# Patient Record
Sex: Male | Born: 1970 | Race: Black or African American | Hispanic: No | Marital: Single | Smoking: Never smoker
Health system: Southern US, Community
[De-identification: ages and names within clinical notes are randomized; demographics above are authoritative.]

---

## 2006-08-27 ENCOUNTER — Emergency Department: Payer: Self-pay | Admitting: Emergency Medicine

## 2009-01-07 ENCOUNTER — Emergency Department: Payer: Self-pay | Admitting: Emergency Medicine

## 2009-11-24 ENCOUNTER — Emergency Department: Payer: Self-pay | Admitting: Internal Medicine

## 2010-05-29 ENCOUNTER — Emergency Department: Payer: Self-pay | Admitting: Emergency Medicine

## 2010-05-30 ENCOUNTER — Emergency Department: Payer: Self-pay | Admitting: Emergency Medicine

## 2011-02-23 ENCOUNTER — Emergency Department: Payer: Self-pay | Admitting: *Deleted

## 2012-05-09 ENCOUNTER — Emergency Department: Payer: Self-pay | Admitting: Emergency Medicine

## 2012-11-04 ENCOUNTER — Emergency Department: Payer: Self-pay | Admitting: Emergency Medicine

## 2012-11-06 LAB — BETA STREP CULTURE(ARMC)

## 2013-06-04 ENCOUNTER — Emergency Department: Payer: Self-pay | Admitting: Emergency Medicine

## 2014-09-10 ENCOUNTER — Emergency Department: Payer: Self-pay | Admitting: Emergency Medicine

## 2015-11-27 IMAGING — CR DG FOOT 2V*L*
1 series · 2 of 2 positions shown · non-contrast
Comparison: None.

CLINICAL DATA: Stepped on a nail.

EXAM:
LEFT FOOT - 2 VIEW

[Series 1: ap · 0.17mm/px · 2 of 2 slices shown]
[im 1/2]
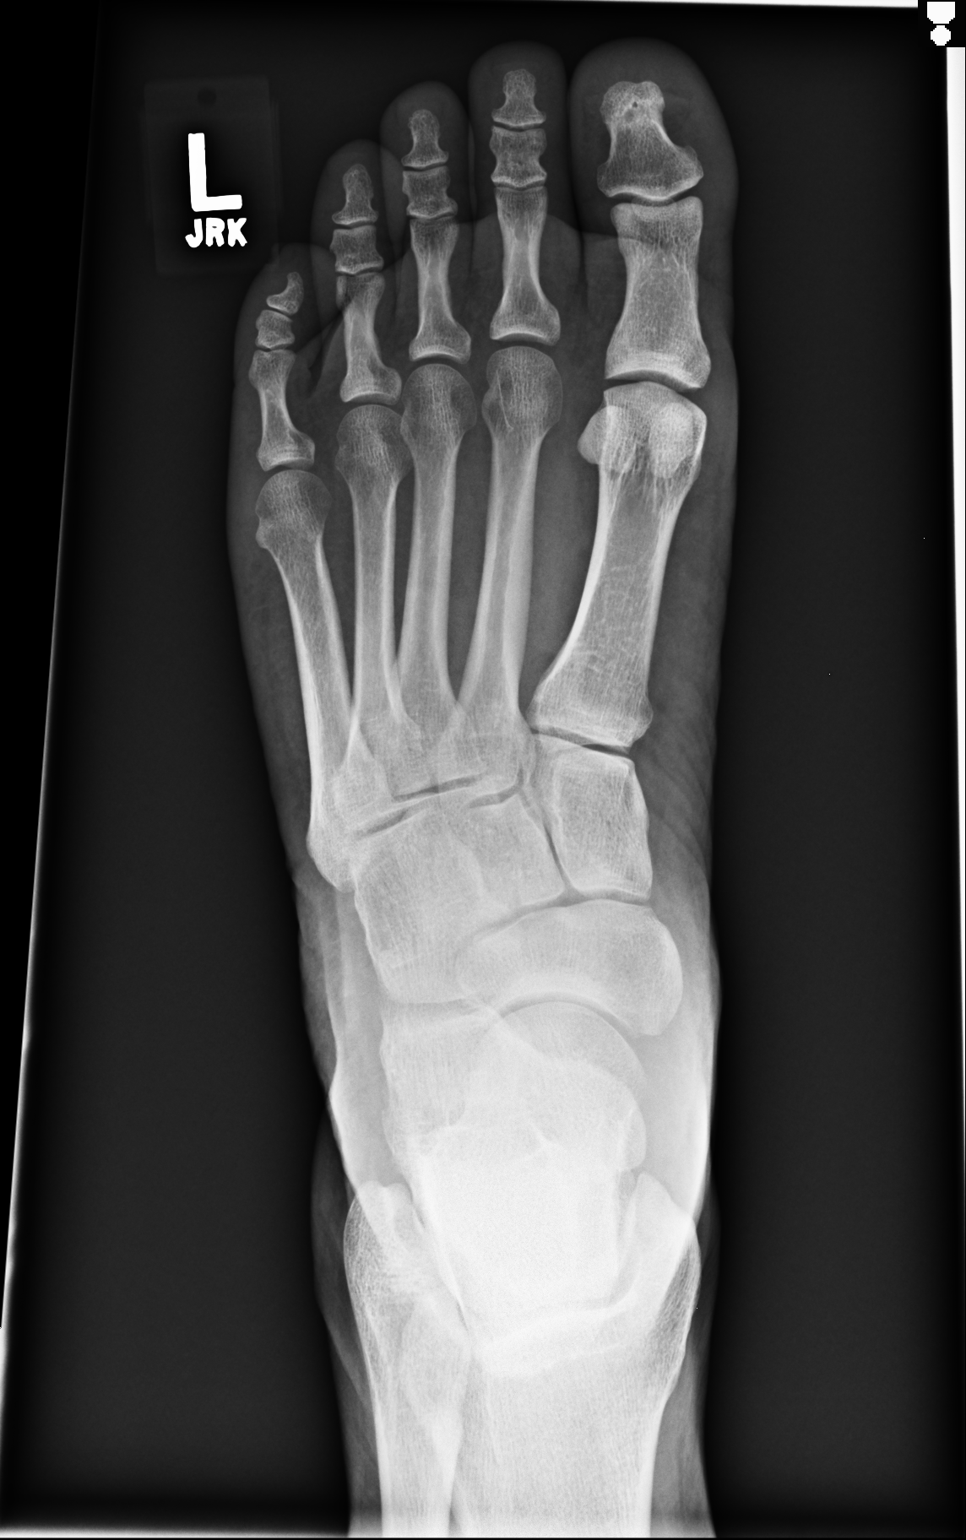
[im 2/2]
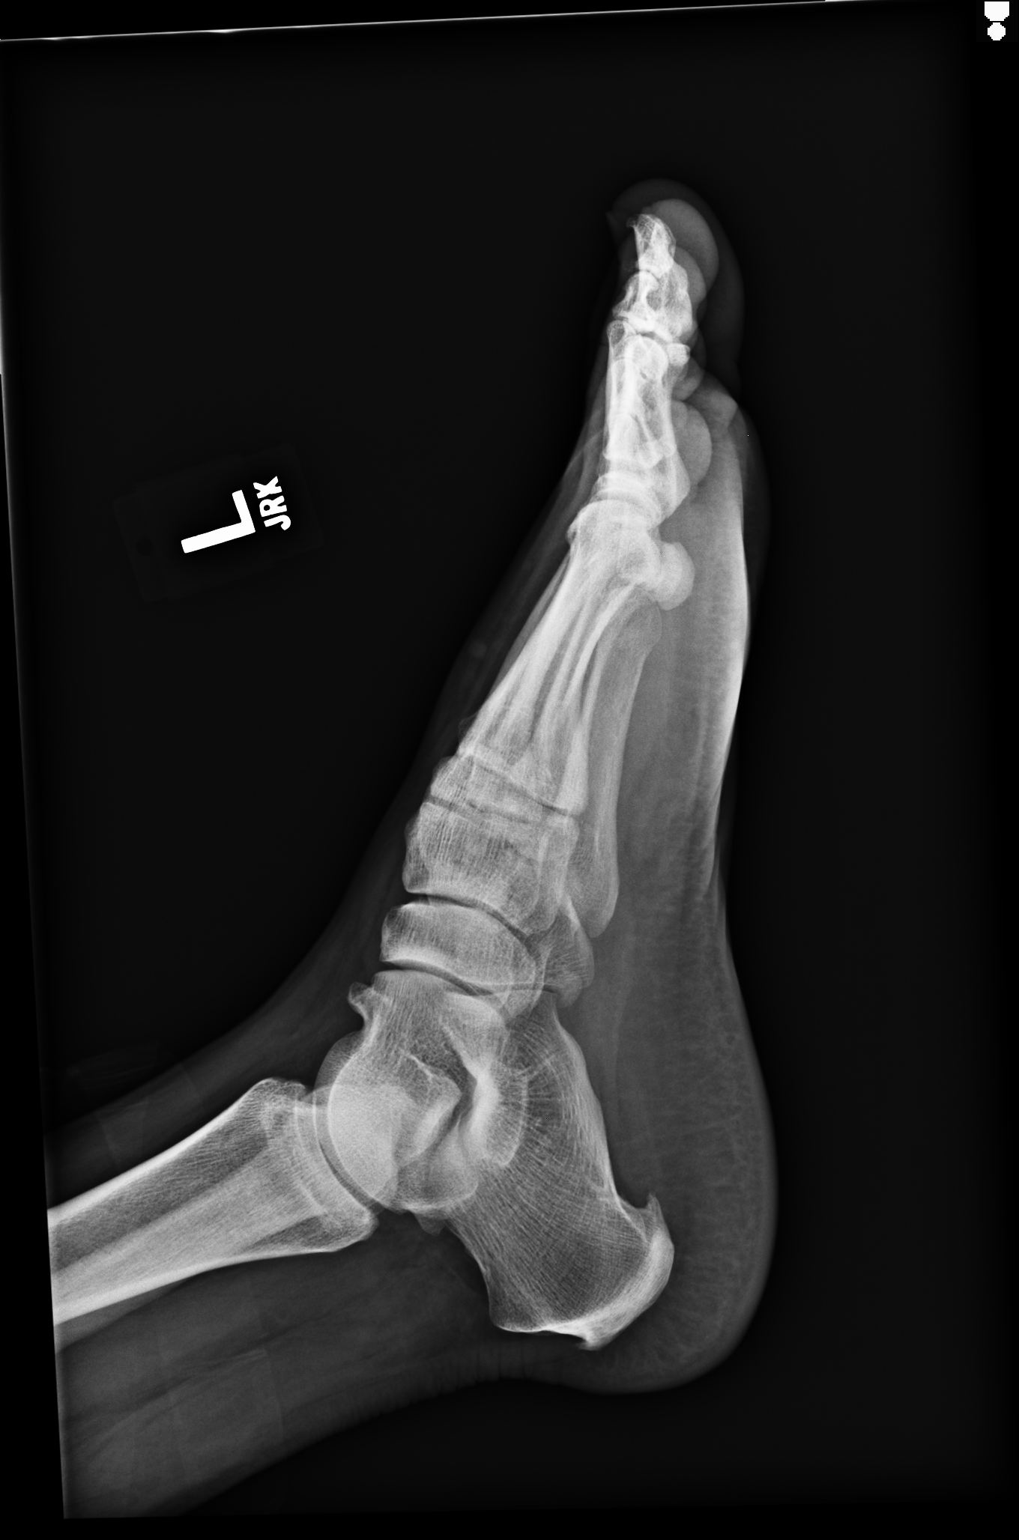

[2 of 2 positions shown; findings below may reference images not displayed]

FINDINGS: There is no evidence of fracture or dislocation. There is no
evidence of arthropathy or other focal bone abnormality. Soft
tissues are unremarkable. No foreign body is seen.
IMPRESSION: Negative.

## 2017-08-11 ENCOUNTER — Encounter: Payer: Self-pay | Admitting: Podiatry

## 2017-08-21 ENCOUNTER — Ambulatory Visit: Payer: BLUE CROSS/BLUE SHIELD | Admitting: Podiatry

## 2017-08-21 ENCOUNTER — Ambulatory Visit (INDEPENDENT_AMBULATORY_CARE_PROVIDER_SITE_OTHER): Payer: BLUE CROSS/BLUE SHIELD

## 2017-08-21 DIAGNOSIS — M722 Plantar fascial fibromatosis: Secondary | ICD-10-CM

## 2017-08-21 MED ORDER — MELOXICAM 15 MG PO TABS: 15.0000 mg | ORAL_TABLET | Freq: Every day | ORAL | 1 refills | Status: AC

## 2017-08-21 MED ORDER — METHYLPREDNISOLONE 4 MG PO TBPK: ORAL_TABLET | ORAL | 0 refills | Status: DC

## 2017-08-21 NOTE — Progress Notes (Signed)
   Subjective:    Patient ID: Gabriel Duncan, male    DOB: 1971/03/14, 47 y.o.   MRN: [id number]  HPI    Review of Systems  All other systems reviewed and are negative.      Objective:   Physical Exam        Assessment & Plan:

## 2017-08-24 NOTE — Progress Notes (Signed)
   Subjective: Patient presents today for pain and tenderness in the plantar aspect of the left heel that began 3 months ago. He states that it hurts in the mornings with the first steps out of bed. Walking and standing for long periods of time increase the pain as well. He has not done anything to treat the symptoms. Patient presents today for further treatment and evaluation.  No past medical history on file.   Objective: Physical Exam General: The patient is alert and oriented x3 in no acute distress.  Dermatology: Skin is warm, dry and supple bilateral lower extremities. Negative for open lesions or macerations bilateral.   Vascular: Dorsalis Pedis and Posterior Tibial pulses palpable bilateral.  Capillary fill time is immediate to all digits.  Neurological: Epicritic and protective threshold intact bilateral.   Musculoskeletal: Tenderness to palpation to the plantar aspect of the left heel along the plantar fascia. All other joints range of motion within normal limits bilateral. Strength 5/5 in all groups bilateral.   Radiographic exam:   Normal osseous mineralization. Joint spaces preserved. No fracture/dislocation/boney destruction. No other soft tissue abnormalities or radiopaque foreign bodies.   Assessment: 1. Plantar fasciitis left foot  Plan of Care:  1. Patient evaluated. Xrays reviewed.   2. Injection of 0.5cc Celestone soluspan injected into the left plantar fascia.  3. Rx for Medrol Dose Pak placed 4. Rx for Mobic 15mg  PO ordered for patient. 4. Plantar fascial band(s) dispensed  5. Instructed patient regarding therapies and modalities at home to alleviate symptoms.  6. Return to clinic in 4 weeks.    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    Felecia ShellingBrent M. Azura Tufaro, DPM [facility]  Dr. Felecia ShellingBrent M. Freeda Spivey, DPM    [address]                                     [place], [place] [address]                Office [phone]  Fax [phone]  [phone]

## 2017-08-31 NOTE — Progress Notes (Signed)
This encounter was created in error - please disregard.

## 2017-09-18 ENCOUNTER — Encounter: Payer: BLUE CROSS/BLUE SHIELD | Admitting: Podiatry

## 2017-09-29 NOTE — Progress Notes (Signed)
This encounter was created in error - please disregard.

## 2019-03-29 ENCOUNTER — Other Ambulatory Visit: Payer: Self-pay

## 2019-03-29 ENCOUNTER — Encounter: Payer: Self-pay | Admitting: Family Medicine

## 2019-03-29 ENCOUNTER — Ambulatory Visit: Payer: Self-pay | Admitting: Family Medicine

## 2019-03-29 DIAGNOSIS — Z113 Encounter for screening for infections with a predominantly sexual mode of transmission: Secondary | ICD-10-CM

## 2019-03-29 DIAGNOSIS — A6002 Herpesviral infection of other male genital organs: Secondary | ICD-10-CM

## 2019-03-29 MED ORDER — VALACYCLOVIR HCL 1 G PO TABS: 1000.0000 mg | ORAL_TABLET | Freq: Every day | ORAL | 3 refills | Status: AC

## 2019-03-29 NOTE — Progress Notes (Signed)
    STI clinic/screening visit  Subjective:  Gabriel Duncan is a 48 y.o. male being seen today for an STI screening visit. The patient reports they do not have symptoms.  Patient has the following medical conditions:  There are no active problems to display for this patient.  No chief complaint on file.   HPI  Patient reports he needs a prescription for Valtrex.  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  He hasn't had an outbreak in 2-3 yrs.  See flowsheet for further details and programmatic requirements.    The following portions of the patient's history were reviewed and updated as appropriate: allergies, current medications, past medical history, past social history, past surgical history and problem list.  Objective:  There were no vitals filed for this visit.  Physical Exam client declined exam and testing.  Assessment and Plan:  Gabriel Duncan is a 48 y.o. male presenting to the [place] for STI screening  1. Screening examination for venereal disease  2. Herpes genitalis in men  - valACYclovir (VALTREX) 1000 MG tablet; Take 1 tablet (1,000 mg total) by mouth daily.  Dispense: 90 tablet; Refill: 3   No follow-ups on file.  No future appointments.  Hassell Done, FNP

## 2019-08-15 ENCOUNTER — Other Ambulatory Visit: Payer: Self-pay

## 2019-08-15 ENCOUNTER — Telehealth: Payer: Self-pay

## 2019-08-15 DIAGNOSIS — Z1211 Encounter for screening for malignant neoplasm of colon: Secondary | ICD-10-CM

## 2019-08-15 NOTE — Telephone Encounter (Signed)
Gastroenterology Pre-Procedure Review  Request Date: Monday 09/26/19 Requesting Physician: Dr. Tobi Bastos  PATIENT REVIEW QUESTIONS: The patient responded to the following health history questions as indicated:    1. Are you having any GI issues? no 2. Do you have a personal history of Polyps? no 3. Do you have a family history of Colon Cancer or Polyps? no 4. Diabetes Mellitus? no 5. Joint replacements in the past 12 months?no 6. Major health problems in the past 3 months?yes (COVID 1 month ago required 4 days of hospitalization at [facility]) 7. Any artificial heart valves, MVP, or defibrillator?no    MEDICATIONS & ALLERGIES:    Patient reports the following regarding taking any anticoagulation/antiplatelet therapy:   Plavix, Coumadin, Eliquis, Xarelto, Lovenox, Pradaxa, Brilinta, or Effient? no Aspirin? no  Patient confirms/reports the following medications:  Current Outpatient Medications  Medication Sig Dispense Refill  . methylPREDNISolone (MEDROL DOSEPAK) 4 MG TBPK tablet 6 day dose pack - take as directed (Patient not taking: Reported on 03/29/2019) 21 tablet 0   No current facility-administered medications for this visit.    Patient confirms/reports the following allergies:  No Known Allergies  No orders of the defined types were placed in this encounter.   AUTHORIZATION INFORMATION Primary Insurance: 1D#: Group #:  Secondary Insurance: 1D#: Group #:  SCHEDULE INFORMATION: Date: Monday 09/26/19 Time: Location:[facility]

## 2019-09-12 ENCOUNTER — Ambulatory Visit (INDEPENDENT_AMBULATORY_CARE_PROVIDER_SITE_OTHER): Payer: BC Managed Care – PPO | Admitting: Urology

## 2019-09-12 ENCOUNTER — Other Ambulatory Visit: Payer: Self-pay

## 2019-09-12 ENCOUNTER — Encounter: Payer: Self-pay | Admitting: Urology

## 2019-09-12 VITALS — BP 126/79 | HR 94 | Ht 68.0 in | Wt 235.0 lb

## 2019-09-12 DIAGNOSIS — N529 Male erectile dysfunction, unspecified: Secondary | ICD-10-CM

## 2019-09-12 MED ORDER — SILDENAFIL CITRATE 20 MG PO TABS: ORAL_TABLET | ORAL | 0 refills | Status: AC

## 2019-09-12 NOTE — Progress Notes (Signed)
   09/12/2019 2:42 PM   [name] 26-Nov-1970 [id number]  Referring provider: Emogene Morgan, MD [address] [place],  [place] [address]  Chief Complaint  Patient presents with  . Erectile Dysfunction    HPI: Gabriel Duncan is a 49 YO male seen in consultation at the request of Dr. Earlene Plater for evaluation of erectile dysfunction.   -Duration 2-3 months -Partial erections firm enough for penetration approximately 50% -Times penetration achieved difficulty maintaining -SHIM 15 indicating mild-moderate ED -No diabetes, hypertension, antihypertensive medications, tobacco history or neurologic disease -No pain or curvature with erections  PMH: No past medical history on file.  Surgical History: History reviewed. No pertinent surgical history.  Home Medications:  Allergies as of 09/12/2019   No Known Allergies     Medication List       Accurate as of September 12, 2019  2:42 PM. If you have any questions, ask your nurse or doctor.        acetaminophen 500 MG tablet Commonly known as: TYLENOL Take by mouth.   aspirin-acetaminophen-caffeine 250-250-65 MG tablet Commonly known as: EXCEDRIN MIGRAINE Take by mouth.   guaifenesin 100 MG/5ML syrup Commonly known as: ROBITUSSIN Take by mouth.   Ibuprofen 200 MG Caps Take by mouth.   methylPREDNISolone 4 MG Tbpk tablet Commonly known as: MEDROL DOSEPAK 6 day dose pack - take as directed   valACYclovir 1000 MG tablet Commonly known as: VALTREX Take 1,000 mg by mouth daily.       Allergies: No Known Allergies  Family History: No family history on file.  Social History:  reports that he has never smoked. He has never used smokeless tobacco. No history on file for alcohol and drug.   Physical Exam: BP 126/79   Pulse 94   Ht 5\' 8"  (1.727 m)   Wt 235 lb (106.6 kg)   BMI 35.73 kg/m   Constitutional:  Alert and oriented, No acute distress. HEENT: [place] AT, moist mucus membranes.  Trachea midline,  no masses. Cardiovascular: No clubbing, cyanosis, or edema. Respiratory: Normal respiratory effort, no increased work of breathing. GI: Abdomen is soft, nontender, nondistended, no abdominal masses GU: Phallus without lesions, testes descended bilaterally without masses or tenderness, testicular volume >20 cc bilaterally. Skin: No rashes, bruises or suspicious lesions. Neurologic: Grossly intact, no focal deficits, moving all 4 extremities. Psychiatric: Normal mood and affect.   Assessment & Plan:    - Erectile dysfunction Mild to moderate ED without organic risk factors.  Potential etiologies were discussed including psychogenic ED and the possibility of hypogonadism though no significant symptoms.  I did recommend checking an a.m. testosterone level.  If normal most likely psychogenic.  He was interested in a brief trial of a PDE 5 inhibitor and Rx generic sildenafil was sent to pharmacy.  He will be notified with his testosterone level.   , MD  [facility] [address], [address] [place], [place] [phone]

## 2019-09-16 ENCOUNTER — Other Ambulatory Visit: Payer: Self-pay

## 2019-09-16 ENCOUNTER — Other Ambulatory Visit: Payer: BC Managed Care – PPO

## 2019-09-16 DIAGNOSIS — N529 Male erectile dysfunction, unspecified: Secondary | ICD-10-CM

## 2019-09-17 LAB — LUTEINIZING HORMONE: LH: 6.1 m[IU]/mL (ref 1.7–8.6)

## 2019-09-17 LAB — TESTOSTERONE: Testosterone: 446 ng/dL (ref 264–916)

## 2019-09-19 ENCOUNTER — Telehealth: Payer: Self-pay | Admitting: *Deleted

## 2019-09-19 NOTE — Telephone Encounter (Signed)
Left message on cell phone

## 2019-09-19 NOTE — Telephone Encounter (Signed)
-----   Message from Riki Altes, MD sent at 09/17/2019 12:27 PM EDT ----- Testosterone level is normal

## 2019-09-22 ENCOUNTER — Other Ambulatory Visit: Admission: RE | Admit: 2019-09-22 | Payer: BC Managed Care – PPO | Source: Ambulatory Visit

## 2019-09-26 ENCOUNTER — Encounter: Payer: Self-pay | Admitting: Anesthesiology

## 2019-09-26 ENCOUNTER — Encounter: Admission: RE | Payer: Self-pay | Source: Home / Self Care

## 2019-09-26 ENCOUNTER — Ambulatory Visit
Admission: RE | Admit: 2019-09-26 | Payer: BC Managed Care – PPO | Source: Home / Self Care | Admitting: Gastroenterology

## 2019-09-26 SURGERY — COLONOSCOPY WITH PROPOFOL
Anesthesia: General

## 2020-05-08 ENCOUNTER — Other Ambulatory Visit: Payer: Self-pay

## 2020-05-08 ENCOUNTER — Ambulatory Visit: Payer: Self-pay | Admitting: Physician Assistant

## 2020-05-08 DIAGNOSIS — B009 Herpesviral infection, unspecified: Secondary | ICD-10-CM

## 2020-05-08 DIAGNOSIS — Z113 Encounter for screening for infections with a predominantly sexual mode of transmission: Secondary | ICD-10-CM

## 2020-05-08 MED ORDER — VALACYCLOVIR HCL 1 G PO TABS: 1000.0000 mg | ORAL_TABLET | Freq: Every day | ORAL | 12 refills | Status: DC

## 2020-05-09 ENCOUNTER — Encounter: Payer: Self-pay | Admitting: Physician Assistant

## 2020-05-09 NOTE — Progress Notes (Signed)
S:  Patient into clinic for Rx for suppressive treatment for HSV.  Denies changes in history since last visit.  Declines screening exam and blood work.  NKDA. O:  WDWN male in NAD, A&O x 3, normal work of breathing. A/P:  1.  Patient with history of HSV and requesting renewal of Rx for suppressive therapy. 2.  Will send Rx for Valtrex 1 g #30 1 po daily with refills for 1 year to patient's pharmacy of choice. 3.  Enc condoms with all sex. 4.  RTC prn and in 1 year.

## 2021-06-02 ENCOUNTER — Other Ambulatory Visit: Payer: Self-pay | Admitting: Physician Assistant

## 2021-06-02 DIAGNOSIS — B009 Herpesviral infection, unspecified: Secondary | ICD-10-CM

## 2021-06-06 ENCOUNTER — Ambulatory Visit: Payer: Self-pay

## 2021-06-06 ENCOUNTER — Other Ambulatory Visit: Payer: Self-pay

## 2021-06-06 ENCOUNTER — Ambulatory Visit: Payer: Self-pay | Admitting: Family Medicine

## 2021-06-06 DIAGNOSIS — B009 Herpesviral infection, unspecified: Secondary | ICD-10-CM

## 2021-06-06 MED ORDER — VALACYCLOVIR HCL 1 G PO TABS: 1000.0000 mg | ORAL_TABLET | Freq: Every day | ORAL | 12 refills | Status: DC

## 2021-06-06 NOTE — Progress Notes (Signed)
S: Patient in clinic for HSV medication refill.  Patient reports running out  of medication.  Patient takes supressively and last outbreak was in 2021.     O: + HSV 2    A/P: 1. HSV-2 infection - valACYclovir (VALTREX) 1000 MG tablet; Take 1 tablet (1,000 mg total) by mouth daily.  Dispense: 30 tablet; Refill: 12    2. Sexually transmitted disease counseling Declined need for other STI testing today.   RX sent to desired pharmacy      Wendi Snipes, FNP

## 2021-11-15 ENCOUNTER — Encounter: Payer: Self-pay | Admitting: Family Medicine

## 2021-11-22 ENCOUNTER — Inpatient Hospital Stay: Payer: BC Managed Care – PPO | Admitting: Internal Medicine

## 2021-11-22 ENCOUNTER — Inpatient Hospital Stay: Payer: BC Managed Care – PPO

## 2021-12-06 ENCOUNTER — Other Ambulatory Visit: Payer: Self-pay

## 2021-12-06 ENCOUNTER — Inpatient Hospital Stay: Payer: BC Managed Care – PPO

## 2021-12-06 ENCOUNTER — Inpatient Hospital Stay: Payer: BC Managed Care – PPO | Attending: Internal Medicine | Admitting: Internal Medicine

## 2021-12-06 ENCOUNTER — Encounter: Payer: Self-pay | Admitting: Internal Medicine

## 2021-12-06 ENCOUNTER — Telehealth: Payer: Self-pay | Admitting: Internal Medicine

## 2021-12-06 DIAGNOSIS — D649 Anemia, unspecified: Secondary | ICD-10-CM

## 2021-12-06 DIAGNOSIS — R0789 Other chest pain: Secondary | ICD-10-CM | POA: Diagnosis not present

## 2021-12-06 DIAGNOSIS — D696 Thrombocytopenia, unspecified: Secondary | ICD-10-CM | POA: Insufficient documentation

## 2021-12-06 DIAGNOSIS — N529 Male erectile dysfunction, unspecified: Secondary | ICD-10-CM | POA: Diagnosis not present

## 2021-12-06 DIAGNOSIS — R5383 Other fatigue: Secondary | ICD-10-CM | POA: Diagnosis not present

## 2021-12-06 NOTE — Progress Notes (Signed)
New patient evaluation.   

## 2021-12-06 NOTE — Progress Notes (Signed)
Lab orders

## 2021-12-06 NOTE — Assessment & Plan Note (Addendum)
#  Mild anemia hemoglobin 11-12; normal MCV platelets 130s [April May 2023]-unclear etiology.  Mild symptoms from fatigue/anemia.   #Had a long discussion with the patient regarding etiology of unexplained anemia/thrombocytopenia.  The etiologies include -benign like liver disease, viral infections, autoimmune; blood loss etc.  Low clinical concern for any malignant causes.  I recommend CBC CMP LDH haptoglobin B12 folic acid reticulocyte count platelet immature fraction; review of peripheral smear.  Iron studies; ferritin. No obvious liver disease noted.   # Screening: Discussed that he will need screening colonoscopy irrespective of above work-up.  Thank you Dr.Aycock for allowing me to participate in the care of your pleasant patient. Please do not hesitate to contact me with questions or concerns in the interim.  # DISPOSITION: # labs today- ordered # follow up in 2-3 weeks- MD: no labs-Dr.B

## 2021-12-06 NOTE — Telephone Encounter (Signed)
Called patient to schedule labs. He forgot to stop by after his appointment.

## 2021-12-06 NOTE — Progress Notes (Signed)
[facility] NOTE  Patient Care Team: Donnie Coffin, MD as PCP - General (Family Medicine) Cammie Sickle, MD as Consulting Physician (Oncology)  CHIEF COMPLAINTS/PURPOSE OF CONSULTATION: ANEMIA   HEMATOLOGY HISTORY  #Mild ANEMIA/thrombocytopenia [APRIL-MAY, 2023-Hb;11.5 MCV-85 platelets-128 WBC -5 [PCP]  Iron sat;ferritin/  GFR- CT/US; EGD/colonoscopy-NONE  HISTORY OF PRESENTING ILLNESS:  Gabriel Duncan 51 y.o.  male pleasant patient was been referred to [place] for further evaluation of anemia.  Patient complains of mild fatigue.  Otherwise denies any weight loss.  Denies any unusual joint pains or worsening bone pain.  Never had any prior endoscopies.  Blood in stools: none Blood in urine:none  Difficulty swallowing:none Change of bowel movement/constipation: none Prior blood transfusion: none Prior history of blood loss: none Liver disease: none Alcohol: beer every other week.  Bariatric surgery: none  Prior evaluation with hematology: none Prior bone marrow biopsy: none Oral iron:  none Prior IV iron infusions: none   Review of Systems  Constitutional:  Positive for malaise/fatigue. Negative for chills, diaphoresis, fever and weight loss.  HENT:  Negative for nosebleeds and sore throat.   Eyes:  Negative for double vision.  Respiratory:  Negative for cough, hemoptysis, sputum production, shortness of breath and wheezing.   Cardiovascular:  Negative for chest pain, palpitations, orthopnea and leg swelling.  Gastrointestinal:  Negative for abdominal pain, blood in stool, constipation, diarrhea, heartburn, melena, nausea and vomiting.  Genitourinary:  Negative for dysuria, frequency and urgency.  Musculoskeletal:  Negative for back pain and joint pain.  Skin: Negative.  Negative for itching and rash.  Neurological:  Negative for dizziness, tingling, focal weakness, weakness and headaches.  Endo/Heme/Allergies:  Does not bruise/bleed  easily.  Psychiatric/Behavioral:  Negative for depression. The patient is not nervous/anxious and does not have insomnia.      MEDICAL HISTORY:  History reviewed. No pertinent past medical history.  SURGICAL HISTORY: History reviewed. No pertinent surgical history.  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 Kids- not home.   Social Determinants of Health   Financial Resource Strain: Not on file  Food Insecurity: Not on file  Transportation Needs: No Transportation Needs (12/06/2021)   PRAPARE - Hydrologist (Medical): No    Lack of Transportation (Non-Medical): No  Physical Activity: Not on file  Stress: Not on file  Social Connections: Not on file  Intimate Partner Violence: Not on file    FAMILY HISTORY: Family History  Problem Relation Age of Onset   Diabetes Mother    Cancer Neg Hx     ALLERGIES:  has No Known Allergies.  MEDICATIONS:  Current Outpatient Medications  Medication Sig Dispense Refill   acetaminophen (TYLENOL) 500 MG tablet Take by mouth.     aspirin-acetaminophen-caffeine (EXCEDRIN MIGRAINE) 250-250-65 MG tablet Take by mouth.     Ibuprofen 200 MG CAPS Take by mouth.  sildenafil (REVATIO) 20 MG tablet 2-3 tabs 1 hour prior to intercourse 10 tablet 0   valACYclovir (VALTREX) 1000 MG tablet Take 1,000 mg by mouth daily.     azithromycin (ZITHROMAX) 250 MG tablet Take 250 mg by mouth as directed. (Patient not  taking: Reported on 12/06/2021)     guaifenesin (ROBITUSSIN) 100 MG/5ML syrup Take by mouth. (Patient not taking: Reported on 12/06/2021)     methylPREDNISolone (MEDROL DOSEPAK) 4 MG TBPK tablet 6 day dose pack - take as directed (Patient not taking: Reported on 03/29/2019) 21 tablet 0   valACYclovir (VALTREX) 1000 MG tablet Take 1 tablet (1,000 mg total) by mouth daily. (Patient not taking: Reported on 12/06/2021) 30 tablet 12   valACYclovir (VALTREX) 1000 MG tablet Take 1 tablet (1,000 mg total) by mouth daily. (Patient not taking: Reported on 12/06/2021) 30 tablet 12   No current facility-administered medications for this visit.     Marland Kitchen  PHYSICAL EXAMINATION:   Vitals:   12/06/21 1100  BP: 113/71  Pulse: 90  Resp: 18  Temp: 98.7 F (37.1 C)   Filed Weights   12/06/21 1100  Weight: 243 lb 9.6 oz (110.5 kg)    Physical Exam Vitals and nursing note reviewed.  Constitutional:      Comments:     HENT:     Head: Normocephalic and atraumatic.     Mouth/Throat:     Mouth: Mucous membranes are moist.     Pharynx: Oropharynx is clear. No oropharyngeal exudate.  Eyes:     Extraocular Movements: Extraocular movements intact.     Pupils: Pupils are equal, round, and reactive to light.  Cardiovascular:     Rate and Rhythm: Normal rate and regular rhythm.  Pulmonary:     Effort: No respiratory distress.     Breath sounds: No wheezing.     Comments: Decreased breath sounds bilaterally.  Abdominal:     General: Bowel sounds are normal. There is no distension.     Palpations: Abdomen is soft. There is no mass.     Tenderness: There is no abdominal tenderness. There is no guarding or rebound.  Musculoskeletal:        General: No tenderness. Normal range of motion.     Cervical back: Normal range of motion and neck supple.  Skin:    General: Skin is warm.  Neurological:     General: No focal deficit present.     Mental Status: He is alert and oriented to person, place, and time.   Psychiatric:        Mood and Affect: Affect normal.        Behavior: Behavior normal.        Judgment: Judgment normal.      LABORATORY DATA:  I have reviewed the data as listed No results found for: "WBC", "HGB", "HCT", "MCV", "PLT" No results for input(s): "NA", "K", "CL", "CO2", "GLUCOSE", "BUN", "CREATININE", "CALCIUM", "GFRNONAA", "GFRAA", "PROT", "ALBUMIN", "AST", "ALT", "ALKPHOS", "BILITOT", "BILIDIR", "IBILI" in the last 8760 hours.   No results found.  ASSESSMENT & PLAN:   Symptomatic anemia #Mild anemia hemoglobin 11-12; normal MCV platelets 130s [April May 2023]-unclear etiology.  Mild symptoms from fatigue/anemia.   #Had a long discussion with the patient regarding etiology of unexplained anemia/thrombocytopenia.  The etiologies include -benign like liver disease, viral infections, autoimmune; blood loss etc.  Low clinical concern for any malignant causes.  I recommend CBC CMP LDH haptoglobin U31 folic acid reticulocyte count platelet immature fraction; review of  peripheral smear.  Iron studies; ferritin. No obvious liver disease noted.   # Screening: Discussed that he will need screening colonoscopy irrespective of above work-up.  Thank you Dr.Aycock for allowing me to participate in the care of your pleasant patient. Please do not hesitate to contact me with questions or concerns in the interim.  # DISPOSITION: # labs today- ordered # follow up in 2-3 weeks- MD: no labs-Dr.B    All questions were answered. The patient knows to call the clinic with any problems, questions or concerns.    Cammie Sickle, MD 12/06/2021 12:54 PM

## 2021-12-18 ENCOUNTER — Other Ambulatory Visit: Payer: Self-pay

## 2021-12-18 ENCOUNTER — Encounter: Payer: Self-pay | Admitting: Internal Medicine

## 2021-12-18 ENCOUNTER — Inpatient Hospital Stay: Payer: BC Managed Care – PPO | Admitting: Internal Medicine

## 2021-12-18 ENCOUNTER — Inpatient Hospital Stay: Payer: BC Managed Care – PPO

## 2021-12-18 DIAGNOSIS — D649 Anemia, unspecified: Secondary | ICD-10-CM | POA: Diagnosis not present

## 2021-12-18 LAB — COMPREHENSIVE METABOLIC PANEL
ALT: 35 U/L (ref 0–44)
AST: 28 U/L (ref 15–41)
Albumin: 4 g/dL (ref 3.5–5.0)
Alkaline Phosphatase: 52 U/L (ref 38–126)
Anion gap: 3 — ABNORMAL LOW (ref 5–15)
BUN: 17 mg/dL (ref 6–20)
CO2: 29 mmol/L (ref 22–32)
Calcium: 8.3 mg/dL — ABNORMAL LOW (ref 8.9–10.3)
Chloride: 106 mmol/L (ref 98–111)
Creatinine, Ser: 1.44 mg/dL — ABNORMAL HIGH (ref 0.61–1.24)
GFR, Estimated: 60 mL/min — ABNORMAL LOW (ref >60)
Glucose, Bld: 83 mg/dL (ref 70–99)
Potassium: 3.6 mmol/L (ref 3.5–5.1)
Sodium: 138 mmol/L (ref 135–145)
Total Bilirubin: 0.6 mg/dL (ref 0.3–1.2)
Total Protein: 7.2 g/dL (ref 6.5–8.1)

## 2021-12-18 LAB — TECHNOLOGIST SMEAR REVIEW
Plt Morphology: NORMAL
RBC MORPHOLOGY: NORMAL
WBC MORPHOLOGY: NORMAL

## 2021-12-18 LAB — CBC WITH DIFFERENTIAL/PLATELET
Abs Immature Granulocytes: 0.01 10*3/uL (ref 0.00–0.07)
Basophils Absolute: 0 10*3/uL (ref 0.0–0.1)
Basophils Relative: 1 %
Eosinophils Absolute: 0.1 10*3/uL (ref 0.0–0.5)
Eosinophils Relative: 2 %
HCT: 39.9 % (ref 39.0–52.0)
Hemoglobin: 12.3 g/dL — ABNORMAL LOW (ref 13.0–17.0)
Immature Granulocytes: 0 %
Lymphocytes Relative: 46 %
Lymphs Abs: 2.6 10*3/uL (ref 0.7–4.0)
MCH: 27.6 pg (ref 26.0–34.0)
MCHC: 30.8 g/dL (ref 30.0–36.0)
MCV: 89.5 fL (ref 80.0–100.0)
Monocytes Absolute: 0.4 10*3/uL (ref 0.1–1.0)
Monocytes Relative: 6 %
Neutro Abs: 2.6 10*3/uL (ref 1.7–7.7)
Neutrophils Relative %: 45 %
Platelets: 120 10*3/uL — ABNORMAL LOW (ref 150–400)
RBC: 4.46 MIL/uL (ref 4.22–5.81)
RDW: 13.2 % (ref 11.5–15.5)
WBC: 5.8 10*3/uL (ref 4.0–10.5)
nRBC: 0 % (ref 0.0–0.2)

## 2021-12-18 LAB — FERRITIN: Ferritin: 159 ng/mL (ref 24–336)

## 2021-12-18 LAB — RETICULOCYTES
Immature Retic Fract: 8.2 % (ref 2.3–15.9)
RBC.: 4.44 MIL/uL (ref 4.22–5.81)
Retic Count, Absolute: 48 10*3/uL (ref 19.0–186.0)
Retic Ct Pct: 1.1 % (ref 0.4–3.1)

## 2021-12-18 LAB — VITAMIN B12: Vitamin B-12: 342 pg/mL (ref 180–914)

## 2021-12-18 LAB — IMMATURE PLATELET FRACTION: Immature Platelet Fraction: 14.2 % — ABNORMAL HIGH (ref 1.2–8.6)

## 2021-12-18 LAB — FOLATE: Folate: 16.7 ng/mL (ref >5.9)

## 2021-12-18 LAB — IRON AND TIBC
Iron: 67 ug/dL (ref 45–182)
Saturation Ratios: 19 % (ref 17.9–39.5)
TIBC: 351 ug/dL (ref 250–450)
UIBC: 284 ug/dL

## 2021-12-18 LAB — LACTATE DEHYDROGENASE: LDH: 138 U/L (ref 98–192)

## 2021-12-18 NOTE — Progress Notes (Signed)
[facility] CONSULT NOTE  Patient Care Team: Emogene Morgan, MD as PCP - General (Family Medicine) Earna Coder, MD as Consulting Physician (Oncology)  CHIEF COMPLAINTS/PURPOSE OF CONSULTATION: ANEMIA   HEMATOLOGY HISTORY  #Mild ANEMIA/thrombocytopenia [APRIL-MAY, 2023-Hb;11.5 MCV-85 platelets-128 WBC -5 [PCP]  Iron sat;ferritin/  GFR- CT/US; EGD/colonoscopy-NONE  HISTORY OF PRESENTING ILLNESS: Alone.  Ambulating independently. Gabriel Duncan 51 y.o.  male pleasant patient is here for follow-up of his mild anemia and thrombocytopenia to review work-up.  Chest discomfort- sharp pain- 3-4/10. Not with exertion. For appx 4-6 hours. Resolved by itself. No NSAIDs.   Patient complains of mild fatigue.  Otherwise denies any weight loss.  Denies any unusual joint pains or worsening bone pain.  Never had any prior endoscopies.  Complains for difficulty with erectile dysfunction.    Review of Systems  Constitutional:  Positive for malaise/fatigue. Negative for chills, diaphoresis, fever and weight loss.  HENT:  Negative for nosebleeds and sore throat.   Eyes:  Negative for double vision.  Respiratory:  Negative for cough, hemoptysis, sputum production, shortness of breath and wheezing.   Cardiovascular:  Negative for chest pain, palpitations, orthopnea and leg swelling.  Gastrointestinal:  Negative for abdominal pain, blood in stool, constipation, diarrhea, heartburn, melena, nausea and vomiting.  Genitourinary:  Negative for dysuria, frequency and urgency.  Musculoskeletal:  Negative for back pain and joint pain.  Skin: Negative.  Negative for itching and rash.  Neurological:  Negative for dizziness, tingling, focal weakness, weakness and headaches.  Endo/Heme/Allergies:  Does not bruise/bleed easily.  Psychiatric/Behavioral:  Negative for depression. The patient is not nervous/anxious and does not have insomnia.      MEDICAL HISTORY:  History reviewed.  No pertinent past medical history.  SURGICAL HISTORY: History reviewed. No pertinent surgical history.  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 Kids- not home.   Social Determinants of Health   Financial Resource Strain: Not on file  Food Insecurity: Not on file  Transportation Needs: No Transportation Needs (12/06/2021)   PRAPARE - Administrator, Civil Service (Medical): No    Lack of Transportation (Non-Medical): No  Physical Activity: Not on file  Stress: Not on file  Social Connections: Not on file  Intimate Partner Violence: Not on file    FAMILY HISTORY: Family History  Problem Relation Age of Onset   Diabetes Mother    Cancer Neg Hx     ALLERGIES:  has No Known Allergies.  MEDICATIONS:  Current Outpatient Medications  Medication Sig Dispense Refill   acetaminophen (TYLENOL) 500 MG tablet Take by mouth.     aspirin-acetaminophen-caffeine (EXCEDRIN MIGRAINE) 250-250-65 MG tablet Take by mouth.     Ibuprofen 200 MG CAPS Take by mouth.     sildenafil (REVATIO) 20 MG tablet 2-3 tabs 1 hour prior to intercourse 10 tablet 0   valACYclovir (VALTREX) 1000 MG  tablet Take 1,000 mg by mouth daily.     No current facility-administered medications for this visit.     Marland Kitchen  PHYSICAL EXAMINATION:   Vitals:   12/18/21 1512  BP: 133/84  Pulse: 68  Temp: 98.2 F (36.8 C)  SpO2: 97%    Filed Weights   12/18/21 1512  Weight: 244 lb (110.7 kg)      Physical Exam Vitals and nursing note reviewed.  Constitutional:      Comments:     HENT:     Head: Normocephalic and atraumatic.     Mouth/Throat:     Mouth: Mucous membranes are moist.     Pharynx: Oropharynx is clear. No oropharyngeal exudate.  Eyes:     Extraocular Movements: Extraocular movements intact.     Pupils: Pupils are equal, round, and reactive to light.  Cardiovascular:     Rate and Rhythm: Normal rate and regular rhythm.  Pulmonary:     Effort: No respiratory distress.     Breath sounds: No wheezing.     Comments: Decreased breath sounds bilaterally.  Abdominal:     General: Bowel sounds are normal. There is no distension.     Palpations: Abdomen is soft. There is no mass.     Tenderness: There is no abdominal tenderness. There is no guarding or rebound.  Musculoskeletal:        General: No tenderness. Normal range of motion.     Cervical back: Normal range of motion and neck supple.  Skin:    General: Skin is warm.  Neurological:     General: No focal deficit present.     Mental Status: He is alert and oriented to person, place, and time.  Psychiatric:        Mood and Affect: Affect normal.        Behavior: Behavior normal.        Judgment: Judgment normal.      LABORATORY DATA:  I have reviewed the data as listed Lab Results  Component Value Date   WBC 5.8 12/18/2021   HGB 12.3 (L) 12/18/2021   HCT 39.9 12/18/2021   MCV 89.5 12/18/2021   PLT 120 (L) 12/18/2021   Recent Labs    12/18/21 1554  NA 138  K 3.6  CL 106  CO2 29  GLUCOSE 83  BUN 17  CREATININE 1.44*  CALCIUM 8.3*  GFRNONAA 60*  PROT 7.2  ALBUMIN 4.0  AST 28  ALT 35  ALKPHOS 52  BILITOT 0.6     No results found.  ASSESSMENT & PLAN:   Symptomatic anemia #Mild anemia hemoglobin 11-12; normal MCV platelets 130s [April May 2023]-unclear etiology.  Mild symptoms from fatigue/anemia.   I again reviewed with the patient regarding etiology of unexplained  anemia/thrombocytopenia.  The etiologies include -benign like liver disease, viral infections, autoimmune; blood loss etc.  Low clinical concern for any malignant causes.  I recommend CBC CMP LDH haptoglobin B12 folic acid reticulocyte count platelet immature fraction; review of peripheral smear.  Iron studies; ferritin. No obvious liver disease noted.  Await labs from today.  # Chest discomfort-atypical cardiac pain.  None currently- defer to PCP for further work up.  Patient had a repeated episode of chest pain he recently PCP/go to emergency room.  Patient verbalized understanding of the same.  #Difficulty with erection- ?  Erectile dysfunction.  Recommend evaluation with urology. Freada Bergeron made  # Screening: Discussed that he will need screening colonoscopy irrespective of above work-up.  #  DISPOSITION: # referral to Urology re: erectile dysfunction # labs today- ordered # follow up in 2-3 weeks- MD: no labs-Dr.B    All questions were answered. The patient knows to call the clinic with any problems, questions or concerns.    Earna Coder, MD 12/18/2021 9:21 PM

## 2021-12-18 NOTE — Assessment & Plan Note (Addendum)
#  Mild anemia hemoglobin 11-12; normal MCV platelets 130s [April May 2023]-unclear etiology.  Mild symptoms from fatigue/anemia.   I again reviewed with the patient regarding etiology of unexplained anemia/thrombocytopenia.  The etiologies include -benign like liver disease, viral infections, autoimmune; blood loss etc.  Low clinical concern for any malignant causes.  I recommend CBC CMP LDH haptoglobin B12 folic acid reticulocyte count platelet immature fraction; review of peripheral smear.  Iron studies; ferritin. No obvious liver disease noted.  Await labs from today.  # Chest discomfort-atypical cardiac pain.  None currently- defer to PCP for further work up.  Patient had a repeated episode of chest pain he recently PCP/go to emergency room.  Patient verbalized understanding of the same.  #Difficulty with erection- ?  Erectile dysfunction.  Recommend evaluation with urology. Gabriel Duncan made  # Screening: Discussed that he will need screening colonoscopy irrespective of above work-up.  # DISPOSITION: # referral to Urology re: erectile dysfunction # labs today- ordered # follow up in 2-3 weeks- MD: no labs-Dr.B

## 2021-12-19 LAB — HAPTOGLOBIN: Haptoglobin: 142 mg/dL (ref 23–355)

## 2022-01-03 ENCOUNTER — Encounter: Payer: Self-pay | Admitting: Internal Medicine

## 2022-01-03 ENCOUNTER — Inpatient Hospital Stay: Payer: BC Managed Care – PPO | Attending: Internal Medicine | Admitting: Internal Medicine

## 2022-01-03 DIAGNOSIS — D649 Anemia, unspecified: Secondary | ICD-10-CM | POA: Diagnosis present

## 2022-01-03 DIAGNOSIS — D696 Thrombocytopenia, unspecified: Secondary | ICD-10-CM | POA: Insufficient documentation

## 2022-01-03 NOTE — Progress Notes (Unsigned)
[facility] CONSULT NOTE  Patient Care Team: Emogene Morgan, MD as PCP - General (Family Medicine) Earna Coder, MD as Consulting Physician (Oncology)  CHIEF COMPLAINTS/PURPOSE OF CONSULTATION: ANEMIA   HEMATOLOGY HISTORY  #Mild ANEMIA/thrombocytopenia [APRIL-MAY, 2023-Hb;11.5 MCV-85 platelets-128 WBC -5 [PCP]  Iron sat;ferritin/  GFR- CT/US; EGD/colonoscopy-NONE  HISTORY OF PRESENTING ILLNESS: Alone.  Ambulating independently. Gabriel Duncan 51 y.o.  male pleasant patient is here for follow-up of his mild anemia and thrombocytopenia to review work-up.  Chest discomfort- sharp pain- 3-4/10. Not with exertion. For appx 4-6 hours. Resolved by itself. No NSAIDs.   Patient complains of mild fatigue.  Otherwise denies any weight loss.  Denies any unusual joint pains or worsening bone pain.  Never had any prior endoscopies.  Complains for difficulty with erectile dysfunction.    Review of Systems  Constitutional:  Positive for malaise/fatigue. Negative for chills, diaphoresis, fever and weight loss.  HENT:  Negative for nosebleeds and sore throat.   Eyes:  Negative for double vision.  Respiratory:  Negative for cough, hemoptysis, sputum production, shortness of breath and wheezing.   Cardiovascular:  Negative for chest pain, palpitations, orthopnea and leg swelling.  Gastrointestinal:  Negative for abdominal pain, blood in stool, constipation, diarrhea, heartburn, melena, nausea and vomiting.  Genitourinary:  Negative for dysuria, frequency and urgency.  Musculoskeletal:  Negative for back pain and joint pain.  Skin: Negative.  Negative for itching and rash.  Neurological:  Negative for dizziness, tingling, focal weakness, weakness and headaches.  Endo/Heme/Allergies:  Does not bruise/bleed easily.  Psychiatric/Behavioral:  Negative for depression. The patient is not nervous/anxious and does not have insomnia.      MEDICAL HISTORY:  History reviewed.  No pertinent past medical history.  SURGICAL HISTORY: History reviewed. No pertinent surgical history.  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 Kids- not home.   Social Determinants of Health   Financial Resource Strain: Not on file  Food Insecurity: Not on file  Transportation Needs: No Transportation Needs (12/06/2021)   PRAPARE - Administrator, Civil Service (Medical): No    Lack of Transportation (Non-Medical): No  Physical Activity: Not on file  Stress: Not on file  Social Connections: Not on file  Intimate Partner Violence: Not on file    FAMILY HISTORY: Family History  Problem Relation Age of Onset   Diabetes Mother    Cancer Neg Hx     ALLERGIES:  has No Known Allergies.  MEDICATIONS:  Current Outpatient Medications  Medication Sig Dispense Refill   valACYclovir (VALTREX) 1000 MG tablet Take 1,000 mg by mouth every other day.     acetaminophen (TYLENOL) 500 MG tablet Take by mouth. (Patient not taking: Reported on 01/03/2022)     aspirin-acetaminophen-caffeine (EXCEDRIN MIGRAINE) 250-250-65 MG tablet Take by mouth. (Patient not taking: Reported on 01/03/2022)     Ibuprofen 200 MG  CAPS Take by mouth. (Patient not taking: Reported on 01/03/2022)     sildenafil (REVATIO) 20 MG tablet 2-3 tabs 1 hour prior to intercourse (Patient not taking: Reported on 01/03/2022) 10 tablet 0   No current facility-administered medications for this visit.      Marland Kitchen  PHYSICAL EXAMINATION:   Vitals:   01/03/22 1507  BP: 134/86  Pulse: 68  Resp: 18  Temp: 98.8 F (37.1 C)     Filed Weights   01/03/22 1507  Weight: 243 lb 6.4 oz (110.4 kg)      Physical Exam Vitals and nursing note reviewed.  Constitutional:      Comments:     HENT:     Head: Normocephalic and atraumatic.     Mouth/Throat:     Mouth: Mucous membranes are moist.     Pharynx: Oropharynx is clear. No oropharyngeal exudate.  Eyes:     Extraocular Movements: Extraocular movements intact.     Pupils: Pupils are equal, round, and reactive to light.  Cardiovascular:     Rate and Rhythm: Normal rate and regular rhythm.  Pulmonary:     Effort: No respiratory distress.     Breath sounds: No wheezing.     Comments: Decreased breath sounds bilaterally.  Abdominal:     General: Bowel sounds are normal. There is no distension.     Palpations: Abdomen is soft. There is no mass.     Tenderness: There is no abdominal tenderness. There is no guarding or rebound.  Musculoskeletal:        General: No tenderness. Normal range of motion.     Cervical back: Normal range of motion and neck supple.  Skin:    General: Skin is warm.  Neurological:     General: No focal deficit present.     Mental Status: He is alert and oriented to person, place, and time.  Psychiatric:        Mood and Affect: Affect normal.        Behavior: Behavior normal.        Judgment: Judgment normal.      LABORATORY DATA:  I have reviewed the data as listed Lab Results  Component Value Date   WBC 5.8 12/18/2021   HGB 12.3 (L) 12/18/2021   HCT 39.9 12/18/2021   MCV 89.5 12/18/2021   PLT 120 (L) 12/18/2021   Recent Labs    12/18/21 1554  NA 138  K 3.6  CL 106  CO2 29  GLUCOSE 83  BUN 17  CREATININE 1.44*  CALCIUM 8.3*  GFRNONAA 60*  PROT 7.2  ALBUMIN 4.0  AST 28  ALT 35  ALKPHOS 52  BILITOT 0.6     No results found.  ASSESSMENT & PLAN:   Symptomatic anemia #Mild anemia  hemoglobin 11-12; normal MCV platelets 130s [April May 2023]-unclear etiology.  Mild symptoms from fatigue/anemia.   I again reviewed with the patient regarding etiology of unexplained anemia/thrombocytopenia.  The etiologies include -benign like liver disease, viral infections, autoimmune; blood loss etc.  Low clinical concern for any malignant causes.  I recommend CBC CMP LDH haptoglobin B12 folic acid reticulocyte count platelet immature fraction; review of peripheral smear.  Iron studies; ferritin. No obvious liver disease noted.  Await labs from today.  # Chest discomfort-atypical cardiac pain.  None currently- defer to PCP for further work up.  Patient had a repeated episode of chest pain he recently PCP/go to emergency room.  Patient verbalized understanding of the same.  #  Difficulty with erection- ?  Erectile dysfunction.  Recommend evaluation with urology. Freada Bergeron made  # Screening: Discussed that he will need screening colonoscopy irrespective of above work-up.  # DISPOSITION: # referral to Urology re: erectile dysfunction # labs today- ordered # follow up in 2-3 weeks- MD: no labs-Dr.B    All questions were answered. The patient knows to call the clinic with any problems, questions or concerns.    Earna Coder, MD 01/03/2022 3:22 PM

## 2022-01-03 NOTE — Progress Notes (Unsigned)
Pt here for follow up. No new concerns voiced.   

## 2022-01-03 NOTE — Assessment & Plan Note (Addendum)
#  Mild anemia hemoglobin 11-12; normal MCV platelets 130s [April May 2023]-unclear etiology.  Mild symptoms from fatigue/anemia.   I again reviewed with the patient regarding etiology of unexplained anemia/thrombocytopenia.  The etiologies include -benign like liver disease, viral infections, autoimmune; blood loss etc.  Low clinical concern for any malignant causes.  I recommend CBC CMP LDH haptoglobin B12 folic acid reticulocyte count platelet immature fraction; review of peripheral smear.  Iron studies; ferritin. No obvious liver disease noted.  Await labs from today.  # Chest discomfort-atypical cardiac pain.  None currently- defer to PCP for further work up.  Patient had a repeated episode of chest pain he recently PCP/go to emergency room.  Patient verbalized understanding of the same.  # Screening: Discussed that he will need screening colonoscopy irrespective of above work-up.  # fatigue: ? OSA; sleep study.   # DISPOSITION: # follow up in 4 months- JJ:OACZ cbc/cmp; hepatis panel/HIV-ordered  -Dr.B

## 2022-01-10 ENCOUNTER — Encounter: Payer: Self-pay | Admitting: Urology

## 2022-01-10 ENCOUNTER — Ambulatory Visit: Payer: BC Managed Care – PPO | Admitting: Urology

## 2022-01-10 VITALS — BP 129/80 | HR 79 | Ht 68.0 in | Wt 238.0 lb

## 2022-01-10 DIAGNOSIS — N529 Male erectile dysfunction, unspecified: Secondary | ICD-10-CM | POA: Diagnosis not present

## 2022-01-10 MED ORDER — TADALAFIL 20 MG PO TABS: ORAL_TABLET | ORAL | 0 refills | Status: AC

## 2022-01-11 ENCOUNTER — Encounter: Payer: Self-pay | Admitting: Urology

## 2022-01-11 NOTE — Progress Notes (Signed)
   01/10/2022 2:47 PM   Gabriel Duncan Mar 03, 1971 [id number]  Referring provider: Emogene Morgan, MD [address] [place],  [place] [address]  Chief Complaint  Patient presents with   Erectile Dysfunction    HPI: 51 y.o. male presents for follow-up of erectile dysfunction  Initially seen 09/12/2019 for ED He had no significant organic risk factors Testosterone level was normal Was given a trial of sildenafil which he states was not effective   PMH: History reviewed. No pertinent past medical history.  Surgical History: History reviewed. No pertinent surgical history.  Home Medications:  Allergies as of 01/10/2022   No Known Allergies      Medication List        Accurate as of January 10, 2022 11:59 PM. If you have any questions, ask your nurse or doctor.          STOP taking these medications    acetaminophen 500 MG tablet Commonly known as: TYLENOL Stopped by: Riki Altes, MD   aspirin-acetaminophen-caffeine [phone] MG tablet Commonly known as: EXCEDRIN MIGRAINE Stopped by: Riki Altes, MD   Ibuprofen 200 MG Caps Stopped by: Riki Altes, MD       TAKE these medications    sildenafil 20 MG tablet Commonly known as: REVATIO 2-3 tabs 1 hour prior to intercourse   tadalafil 20 MG tablet Commonly known as: CIALIS Take 1 tab 1 hour prior to Intercourse Started by: Riki Altes, MD   valACYclovir 1000 MG tablet Commonly known as: VALTREX Take 1,000 mg by mouth every other day.        Allergies: No Known Allergies  Family History: Family History  Problem Relation Age of Onset   Diabetes Mother    Cancer Neg Hx     Social History:  reports that he has never smoked. He has never used smokeless tobacco. He reports that he does not currently use alcohol. He reports that he does not use drugs.   Physical Exam: BP 129/80   Pulse 79   Ht 5\' 8"  (1.727 m)   Wt 238 lb (108 kg)   BMI 36.19 kg/m   Constitutional:   Alert and oriented, No acute distress. HEENT: [place] AT, moist mucus membranes.  Trachea midline, no masses. Cardiovascular: No clubbing, cyanosis, or edema. Respiratory: Normal respiratory effort, no increased work of breathing. Psychiatric: Normal mood and affect.   Assessment & Plan:    1.  Erectile dysfunction No significant organic risk factors He was interested in a trial of tadalafil and Rx sent to pharmacy If not effective would consider referral for penile vascular testing    , MD  [facility] [address] [place], [place] [phone]

## 2022-05-06 ENCOUNTER — Other Ambulatory Visit: Payer: BC Managed Care – PPO

## 2022-05-06 ENCOUNTER — Ambulatory Visit: Payer: BC Managed Care – PPO | Admitting: Internal Medicine

## 2022-05-06 ENCOUNTER — Inpatient Hospital Stay (HOSPITAL_BASED_OUTPATIENT_CLINIC_OR_DEPARTMENT_OTHER): Payer: BC Managed Care – PPO | Admitting: Internal Medicine

## 2022-05-06 ENCOUNTER — Encounter: Payer: Self-pay | Admitting: Internal Medicine

## 2022-05-06 ENCOUNTER — Inpatient Hospital Stay: Payer: BC Managed Care – PPO | Attending: Internal Medicine

## 2022-05-06 VITALS — BP 134/78 | HR 72 | Temp 96.8°F | Resp 19 | Wt 249.3 lb

## 2022-05-06 DIAGNOSIS — D649 Anemia, unspecified: Secondary | ICD-10-CM | POA: Diagnosis present

## 2022-05-06 DIAGNOSIS — D696 Thrombocytopenia, unspecified: Secondary | ICD-10-CM | POA: Insufficient documentation

## 2022-05-06 DIAGNOSIS — R5383 Other fatigue: Secondary | ICD-10-CM | POA: Insufficient documentation

## 2022-05-06 LAB — CBC WITH DIFFERENTIAL/PLATELET
Abs Immature Granulocytes: 0.01 10*3/uL (ref 0.00–0.07)
Basophils Absolute: 0 10*3/uL (ref 0.0–0.1)
Basophils Relative: 0 %
Eosinophils Absolute: 0.1 10*3/uL (ref 0.0–0.5)
Eosinophils Relative: 2 %
HCT: 41.3 % (ref 39.0–52.0)
Hemoglobin: 12.6 g/dL — ABNORMAL LOW (ref 13.0–17.0)
Immature Granulocytes: 0 %
Lymphocytes Relative: 44 %
Lymphs Abs: 2.5 10*3/uL (ref 0.7–4.0)
MCH: 27.1 pg (ref 26.0–34.0)
MCHC: 30.5 g/dL (ref 30.0–36.0)
MCV: 88.8 fL (ref 80.0–100.0)
Monocytes Absolute: 0.4 10*3/uL (ref 0.1–1.0)
Monocytes Relative: 8 %
Neutro Abs: 2.5 10*3/uL (ref 1.7–7.7)
Neutrophils Relative %: 46 %
Platelets: 146 10*3/uL — ABNORMAL LOW (ref 150–400)
RBC: 4.65 MIL/uL (ref 4.22–5.81)
RDW: 13.1 % (ref 11.5–15.5)
WBC: 5.6 10*3/uL (ref 4.0–10.5)
nRBC: 0 % (ref 0.0–0.2)

## 2022-05-06 LAB — HIV ANTIBODY (ROUTINE TESTING W REFLEX): HIV Screen 4th Generation wRfx: NONREACTIVE

## 2022-05-06 LAB — HEPATITIS B SURFACE ANTIGEN: Hepatitis B Surface Ag: NONREACTIVE

## 2022-05-06 LAB — COMPREHENSIVE METABOLIC PANEL
ALT: 30 U/L (ref 0–44)
AST: 26 U/L (ref 15–41)
Albumin: 4.4 g/dL (ref 3.5–5.0)
Alkaline Phosphatase: 51 U/L (ref 38–126)
Anion gap: 5 (ref 5–15)
BUN: 16 mg/dL (ref 6–20)
CO2: 28 mmol/L (ref 22–32)
Calcium: 9.3 mg/dL (ref 8.9–10.3)
Chloride: 108 mmol/L (ref 98–111)
Creatinine, Ser: 1.04 mg/dL (ref 0.61–1.24)
GFR, Estimated: >60 mL/min (ref >60)
Glucose, Bld: 86 mg/dL (ref 70–99)
Potassium: 3.6 mmol/L (ref 3.5–5.1)
Sodium: 141 mmol/L (ref 135–145)
Total Bilirubin: 0.9 mg/dL (ref 0.3–1.2)
Total Protein: 7.7 g/dL (ref 6.5–8.1)

## 2022-05-06 NOTE — Progress Notes (Signed)
[facility] CONSULT NOTE  Patient Care Team: Emogene Morgan, MD as PCP - General (Family Medicine) Earna Coder, MD as Consulting Physician (Oncology)  CHIEF COMPLAINTS/PURPOSE OF CONSULTATION: ANEMIA   HEMATOLOGY HISTORY  #Mild ANEMIA/thrombocytopenia [APRIL-MAY, 2023-Hb;11.5 MCV-85 platelets-128 WBC -5 [PCP]  Iron sat;ferritin/  GFR- CT/US; EGD/colonoscopy-NONE  HISTORY OF PRESENTING ILLNESS: Alone.  Ambulating independently. Gabriel Duncan 51 y.o.  male pleasant patient is here for follow-up of his mild anemia and thrombocytopenia to review work-up. Patient reports feeling well. Patient denies fever, chills, nausea, vomiting, shortness of breath, cough, abdominal pain, bleeding, bowel or bladder issues. Energy level is good.  Appetite is good.  Denies any weight loss. Denies pain.     Review of Systems  Constitutional:  Positive for malaise/fatigue. Negative for chills, diaphoresis, fever and weight loss.  HENT:  Negative for nosebleeds and sore throat.   Eyes:  Negative for double vision.  Respiratory:  Negative for cough, hemoptysis, sputum production, shortness of breath and wheezing.   Cardiovascular:  Negative for chest pain, palpitations, orthopnea and leg swelling.  Gastrointestinal:  Negative for abdominal pain, blood in stool, constipation, diarrhea, heartburn, melena, nausea and vomiting.  Genitourinary:  Negative for dysuria, frequency and urgency.  Musculoskeletal:  Negative for back pain and joint pain.  Skin: Negative.  Negative for itching and rash.  Neurological:  Negative for dizziness, tingling, focal weakness, weakness and headaches.  Endo/Heme/Allergies:  Does not bruise/bleed easily.  Psychiatric/Behavioral:  Negative for depression. The patient is not nervous/anxious and does not have insomnia.      MEDICAL HISTORY:  History reviewed. No pertinent past medical history.  SURGICAL HISTORY: History reviewed. No pertinent  surgical history.  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 Kids- not home.   Social Determinants of Health   Financial Resource Strain: Not on file  Food Insecurity: Not on file  Transportation Needs: No Transportation Needs (12/06/2021)   PRAPARE - Administrator, Civil Service (Medical): No    Lack of Transportation (Non-Medical): No  Physical Activity: Not on file  Stress: Not on file  Social Connections: Not on file  Intimate Partner Violence: Not on file    FAMILY HISTORY: Family History  Problem Relation Age of Onset   Diabetes Mother    Cancer Neg Hx     ALLERGIES:  has No Known Allergies.  MEDICATIONS:  Current Outpatient Medications  Medication Sig Dispense Refill   tadalafil (CIALIS) 20 MG tablet Take 1 tab 1 hour prior to Intercourse 10 tablet 0   valACYclovir (VALTREX) 1000 MG tablet Take 1,000 mg by mouth every other day.     sildenafil (REVATIO) 20 MG tablet 2-3 tabs 1 hour prior to intercourse (Patient not taking: Reported on 01/03/2022) 10 tablet 0   No current facility-administered medications for this visit.     Marland Kitchen  PHYSICAL EXAMINATION:  Vitals:   05/06/22 1556  BP: 134/78  Pulse: 72  Resp: 19  Temp: (!) 96.8 F (36 C)  SpO2: 98%     Filed Weights   05/06/22 1556  Weight: 249 lb 4.8 oz (113.1 kg)      Physical Exam Vitals and nursing note reviewed.  Constitutional:      Comments:     HENT:     Head:  Normocephalic and atraumatic.     Mouth/Throat:     Mouth: Mucous membranes are moist.     Pharynx: Oropharynx is clear. No oropharyngeal exudate.  Eyes:     Extraocular Movements: Extraocular movements intact.     Pupils: Pupils are equal, round, and reactive to light.  Cardiovascular:     Rate and Rhythm: Normal rate and regular rhythm.  Pulmonary:     Effort: No respiratory distress.     Breath sounds: No wheezing.     Comments: Decreased breath sounds bilaterally.  Abdominal:     General: Bowel sounds are normal. There is no distension.     Palpations: Abdomen is soft. There is no mass.     Tenderness: There is no abdominal tenderness. There is no guarding or rebound.  Musculoskeletal:        General: No tenderness. Normal range of motion.     Cervical back: Normal range of motion and neck supple.  Skin:    General: Skin is warm.  Neurological:     General: No focal deficit present.     Mental Status: He is alert and oriented to person, place, and time.  Psychiatric:        Mood and Affect: Affect normal.        Behavior: Behavior normal.        Judgment: Judgment normal.      LABORATORY DATA:  I have reviewed the data as listed Lab Results  Component Value Date   WBC 5.6 05/06/2022   HGB 12.6 (L) 05/06/2022   HCT 41.3 05/06/2022   MCV 88.8 05/06/2022   PLT 146 (L) 05/06/2022   Recent Labs    12/18/21 1554 05/06/22 1537  NA 138 141  K 3.6 3.6  CL 106 108  CO2 29 28  GLUCOSE 83 86  BUN 17 16  CREATININE 1.44* 1.04  CALCIUM 8.3* 9.3  GFRNONAA 60* >60  PROT 7.2 7.7  ALBUMIN 4.0 4.4  AST 28 26  ALT 35 30  ALKPHOS 52 51  BILITOT 0.6 0.9      No results found.  ASSESSMENT & PLAN:   #Mild anemia hemoglobin 11-12; normal MCV -unclear etiology.  July 2023-iron saturation 19% ferritin 132;    #Mild thrombocytopenia- > work-up so far including B12, folate, hemolytic panel, peripheral smear has been negative.  Hepatitis/HIV panel pending.  Platelets  improved today from 130 to 146.  Continue to monitor. ?ITP.   # fatigue: Clinically concerning for? OSA; discussed regarding sleep study.  Defer to PCP for further work-up.   # DISPOSITION: # follow up in 5 months with Dr. Jacinto Reap, labs.        All questions were answered. The patient knows to call the clinic with any problems, questions or concerns.    Jane Canary, MD 05/06/2022 4:46 PM

## 2022-05-07 LAB — HEPATITIS C ANTIBODY: HCV Ab: NONREACTIVE

## 2022-05-07 LAB — HEPATITIS B CORE ANTIBODY, IGM: Hep B C IgM: NONREACTIVE

## 2022-10-06 ENCOUNTER — Other Ambulatory Visit: Payer: Self-pay

## 2022-10-06 ENCOUNTER — Inpatient Hospital Stay: Payer: BC Managed Care – PPO

## 2022-10-06 ENCOUNTER — Inpatient Hospital Stay: Payer: BC Managed Care – PPO | Admitting: Internal Medicine

## 2022-10-17 ENCOUNTER — Other Ambulatory Visit: Payer: Self-pay | Admitting: *Deleted

## 2022-10-17 DIAGNOSIS — D649 Anemia, unspecified: Secondary | ICD-10-CM

## 2022-10-20 ENCOUNTER — Encounter: Payer: Self-pay | Admitting: Internal Medicine

## 2022-10-20 ENCOUNTER — Inpatient Hospital Stay: Payer: BC Managed Care – PPO

## 2022-10-20 ENCOUNTER — Inpatient Hospital Stay: Payer: BC Managed Care – PPO | Attending: Internal Medicine | Admitting: Internal Medicine
